# Patient Record
Sex: Male | Born: 1990 | Race: Black or African American | Hispanic: No | Marital: Single | Smoking: Never smoker
Health system: Southern US, Community
[De-identification: ages and names within clinical notes are randomized; demographics above are authoritative.]

---
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
## 2017-09-14 ENCOUNTER — Other Ambulatory Visit: Payer: Self-pay | Admitting: Specialist

## 2017-09-14 DIAGNOSIS — G44209 Tension-type headache, unspecified, not intractable: Secondary | ICD-10-CM

## 2017-09-14 DIAGNOSIS — M5412 Radiculopathy, cervical region: Secondary | ICD-10-CM

## 2017-09-19 ENCOUNTER — Other Ambulatory Visit: Payer: Self-pay

## 2017-09-24 ENCOUNTER — Ambulatory Visit
Admission: RE | Admit: 2017-09-24 | Discharge: 2017-09-24 | Disposition: A | Discharge status: Home / Self Care | Payer: Self-pay | Source: Ambulatory Visit | Attending: Specialist | Admitting: Specialist

## 2017-09-24 ENCOUNTER — Other Ambulatory Visit: Payer: Self-pay

## 2017-09-24 DIAGNOSIS — M5412 Radiculopathy, cervical region: Secondary | ICD-10-CM

## 2017-09-24 DIAGNOSIS — G44209 Tension-type headache, unspecified, not intractable: Secondary | ICD-10-CM

## 2017-09-26 ENCOUNTER — Other Ambulatory Visit: Payer: Self-pay | Admitting: Specialist

## 2017-09-26 ENCOUNTER — Ambulatory Visit
Admission: RE | Admit: 2017-09-26 | Discharge: 2017-09-26 | Disposition: A | Discharge status: Home / Self Care | Payer: BC Managed Care – PPO | Source: Ambulatory Visit | Attending: Specialist | Admitting: Specialist

## 2017-09-26 DIAGNOSIS — M25511 Pain in right shoulder: Secondary | ICD-10-CM

## 2018-06-02 ENCOUNTER — Encounter: Payer: Self-pay | Admitting: Family Medicine

## 2018-06-02 ENCOUNTER — Ambulatory Visit (INDEPENDENT_AMBULATORY_CARE_PROVIDER_SITE_OTHER): Payer: BC Managed Care – PPO | Admitting: Family Medicine

## 2018-06-02 VITALS — BP 120/70 | HR 59 | Temp 98.3°F | Ht 76.0 in | Wt 196.1 lb

## 2018-06-02 DIAGNOSIS — F320 Major depressive disorder, single episode, mild: Secondary | ICD-10-CM | POA: Insufficient documentation

## 2018-06-02 DIAGNOSIS — F4310 Post-traumatic stress disorder, unspecified: Secondary | ICD-10-CM | POA: Diagnosis not present

## 2018-06-02 DIAGNOSIS — Z0000 Encounter for general adult medical examination without abnormal findings: Secondary | ICD-10-CM

## 2018-06-02 NOTE — Patient Instructions (Signed)
Posttraumatic Stress Disorder Posttraumatic stress disorder (PTSD) is a mental health disorder that can occur after a traumatic event, such as a threat to life, serious injury, or sexual violence. Some people who experience these types of events may develop PTSD. Sometimes, PTSD can occur in people who hear about trauma that occurs to a close family member or friend. PTSD can happen to anyone at any age. What increases the risk? This condition is more likely to occur in:  Engineer, manufacturing.  People who have been the victims of, or witness to, a traumatic event, such as: ? Domestic violence. ? Childhood physical or sexual abuse. ? Rape. ? Natural disasters. ? Accidents involving serious injury.  What are the signs or symptoms? Symptoms of PTSD can be grouped into several categories: intrusive, avoidance, increased arousal, and negative moods and thoughts. Intrusive Symptoms This is when a person re-experiences the traumatic event through one or more of the following ways:  Distressing dreams.  Feelings of fear, horror, intense sadness, or anger in response to a reminder of the trauma.  Unwanted distressing memories while awake.  Physical reactions triggered by reminders of the trauma, such as increased heart rate, shortness of breath, sweating, and shaking.  Having flashbacks, or feelings like you are going through the event again.  Avoidance Symptoms This is when a person avoids thoughts, conversations, people, or activities that are reminders of the trauma. Symptoms may also include:  Decreased interest or participation in daily activities.  Loss of connection or avoidance of other people.  Increased Arousal Symptoms This is when a person is more sensitive or reacts more easily to their environment. Symptoms may include:  Being easily startled.  Careless or self-destructive behavior.  Irritability.  Feeling on edge.  Difficulty  concentrating.  Verbal or physical outbursts of anger toward other people or objects.  Difficulty sleeping.  Negative Moods or Thoughts These may include:  Belief that oneself or others are bad.  Regular feelings of fear, horror, anger, sadness, guilt, or shame.  Not being able to remember certain parts of the traumatic event.  Blaming themselves or others for the trauma.  Inability to experience positive emotions, such as happiness or love.  PTSD symptoms may start soon after a frightening event or months or years later. Symptoms last at least one month and tend to disrupt relationships, work, and daily activities. How is this diagnosed? PTSD is diagnosed through an assessment by a mental health professional. Daniel Barron will be asked questions about any traumatic events. You will also be asked about how these events have changed your thoughts, mood, behavior, and ability to function on a daily basis. You may also be asked if you use alcohol or drugs. How is this treated? Treatment for PTSD may include:  Medicines. Certain medicines can reduce some PTSD symptoms.  Counseling (cognitive behavioral therapy). Talk therapy with a mental health professional who is experienced in treating PTSD can help.  Eye movement desensitization and reprocessing therapy (EMDR). This type of therapy occurs with a specialized therapist.  Many people with PTSD benefit from a combination of these treatments. If you have other mental health problems, such as depression, alcohol abuse, or drug addiction, your treatment plan will include treatment for these other conditions. Follow these instructions at home: Lifestyle  Find a support group in your community. Often groups are available for [employer] veterans, trauma victims, and family members or caregivers.  Find ways to relax. This may include: ? Breathing exercises. ? Meditation. ?  Yoga. ? Listening to quiet music.  Exercise regularly. Try to do at least  30 minutes of physical activity most days of the week.  Try to get 7-9 hours of sleep each night. To help with sleep: ? Keep your bedroom cool and dark. ? Do not eat a heavy meal within one hour of bedtime. ? Do not drink alcohol or caffeinated drinks before bed. ? Avoid screen time, such as television, computers, tablets, or cell phones before bed.  Do not drink alcohol or take illegal drugs.  Look into volunteer opportunities. This can help you feel more connected to your community.  Take steps to help yourself feel safer at home, such as by installing a security system.  Contact a local organization to find out if you are eligible for a service dog.  Keep daily contact with at least one trusted friend or family member.  If your PTSD is affecting your marriage or family, seek help from a family therapist. General instructions  Take over-the-counter and prescription medicines only as told by your health care provider.  Keep all follow-up visits as told by your health care provider and counselor. This is important.  Make sure to let all of your health care providers know you have PTSD. This is especially important if you are having surgery or need to be admitted to the hospital. Contact a health care provider if:  Your symptoms do not get better or get worse. Get help right away if:  You have thoughts of wanting to harm yourself or others. This information is not intended to replace advice given to you by your health care provider. Make sure you discuss any questions you have with your health care provider. Document Released: 07/27/2001 Document Revised: 02/23/2016 Document Reviewed: 10/17/2015 Elsevier Interactive Patient Education  2018 Elsevier Inc.  
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
## 2018-06-13 ENCOUNTER — Encounter (HOSPITAL_COMMUNITY): Payer: Self-pay | Admitting: Licensed Clinical Social Worker

## 2018-06-13 ENCOUNTER — Ambulatory Visit (INDEPENDENT_AMBULATORY_CARE_PROVIDER_SITE_OTHER): Payer: BC Managed Care – PPO | Admitting: Licensed Clinical Social Worker

## 2018-06-13 DIAGNOSIS — F320 Major depressive disorder, single episode, mild: Secondary | ICD-10-CM | POA: Diagnosis not present

## 2018-06-13 DIAGNOSIS — F4310 Post-traumatic stress disorder, unspecified: Secondary | ICD-10-CM | POA: Diagnosis not present
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
## 2018-07-25 ENCOUNTER — Ambulatory Visit (HOSPITAL_COMMUNITY): Payer: BC Managed Care – PPO | Admitting: Licensed Clinical Social Worker

## 2018-07-25 DIAGNOSIS — F320 Major depressive disorder, single episode, mild: Secondary | ICD-10-CM

## 2018-07-26 ENCOUNTER — Encounter (HOSPITAL_COMMUNITY): Payer: Self-pay | Admitting: Licensed Clinical Social Worker
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
## 2018-08-09 ENCOUNTER — Ambulatory Visit (HOSPITAL_COMMUNITY): Payer: Self-pay | Admitting: Licensed Clinical Social Worker

## 2018-08-30 ENCOUNTER — Ambulatory Visit (INDEPENDENT_AMBULATORY_CARE_PROVIDER_SITE_OTHER): Payer: BC Managed Care – PPO | Admitting: Licensed Clinical Social Worker

## 2018-08-30 ENCOUNTER — Encounter (HOSPITAL_COMMUNITY): Payer: Self-pay | Admitting: Licensed Clinical Social Worker

## 2018-08-30 DIAGNOSIS — F320 Major depressive disorder, single episode, mild: Secondary | ICD-10-CM

## 2018-08-30 DIAGNOSIS — F4310 Post-traumatic stress disorder, unspecified: Secondary | ICD-10-CM | POA: Diagnosis not present
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
## 2018-10-24 ENCOUNTER — Ambulatory Visit (INDEPENDENT_AMBULATORY_CARE_PROVIDER_SITE_OTHER): Payer: BC Managed Care – PPO | Admitting: Licensed Clinical Social Worker

## 2018-10-24 DIAGNOSIS — F320 Major depressive disorder, single episode, mild: Secondary | ICD-10-CM | POA: Diagnosis not present

## 2018-10-31 ENCOUNTER — Encounter (HOSPITAL_COMMUNITY): Payer: Self-pay | Admitting: Licensed Clinical Social Worker
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
## 2018-11-22 ENCOUNTER — Ambulatory Visit (HOSPITAL_COMMUNITY): Payer: BC Managed Care – PPO | Admitting: Licensed Clinical Social Worker
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
## 2018-12-05 ENCOUNTER — Ambulatory Visit (INDEPENDENT_AMBULATORY_CARE_PROVIDER_SITE_OTHER): Payer: BC Managed Care – PPO | Admitting: Licensed Clinical Social Worker

## 2018-12-05 DIAGNOSIS — F4310 Post-traumatic stress disorder, unspecified: Secondary | ICD-10-CM | POA: Diagnosis not present

## 2018-12-05 DIAGNOSIS — F320 Major depressive disorder, single episode, mild: Secondary | ICD-10-CM

## 2018-12-06 ENCOUNTER — Encounter (HOSPITAL_COMMUNITY): Payer: Self-pay | Admitting: Licensed Clinical Social Worker
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
## 2018-12-26 ENCOUNTER — Ambulatory Visit (HOSPITAL_COMMUNITY): Payer: BC Managed Care – PPO | Admitting: Licensed Clinical Social Worker

## 2019-01-03 ENCOUNTER — Ambulatory Visit (INDEPENDENT_AMBULATORY_CARE_PROVIDER_SITE_OTHER): Payer: BC Managed Care – PPO | Admitting: Licensed Clinical Social Worker

## 2019-01-03 ENCOUNTER — Encounter (HOSPITAL_COMMUNITY): Payer: Self-pay | Admitting: Licensed Clinical Social Worker

## 2019-01-03 DIAGNOSIS — F4310 Post-traumatic stress disorder, unspecified: Secondary | ICD-10-CM

## 2019-01-03 DIAGNOSIS — F320 Major depressive disorder, single episode, mild: Secondary | ICD-10-CM | POA: Diagnosis not present
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
## 2019-01-17 ENCOUNTER — Ambulatory Visit (HOSPITAL_COMMUNITY): Payer: BC Managed Care – PPO | Admitting: Licensed Clinical Social Worker

## 2019-01-30 ENCOUNTER — Ambulatory Visit (HOSPITAL_COMMUNITY): Payer: BC Managed Care – PPO | Admitting: Licensed Clinical Social Worker

## 2019-01-31 ENCOUNTER — Ambulatory Visit (HOSPITAL_COMMUNITY): Payer: BC Managed Care – PPO | Admitting: Licensed Clinical Social Worker

## 2019-02-05 ENCOUNTER — Ambulatory Visit (HOSPITAL_COMMUNITY): Payer: BC Managed Care – PPO | Admitting: Licensed Clinical Social Worker

## 2019-03-06 ENCOUNTER — Other Ambulatory Visit: Payer: Self-pay

## 2019-03-06 ENCOUNTER — Ambulatory Visit (INDEPENDENT_AMBULATORY_CARE_PROVIDER_SITE_OTHER): Payer: BC Managed Care – PPO | Admitting: Licensed Clinical Social Worker

## 2019-03-06 ENCOUNTER — Encounter (HOSPITAL_COMMUNITY): Payer: Self-pay | Admitting: Licensed Clinical Social Worker

## 2019-03-06 DIAGNOSIS — F4310 Post-traumatic stress disorder, unspecified: Secondary | ICD-10-CM

## 2019-03-06 DIAGNOSIS — F320 Major depressive disorder, single episode, mild: Secondary | ICD-10-CM
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
## 2019-03-13 ENCOUNTER — Telehealth: Payer: Self-pay | Admitting: Family Medicine

## 2019-03-13 NOTE — Telephone Encounter (Signed)
I called and left message on patient voicemail to call office and schedule follow up appointment with Dr. Kremer.  °

## 2019-04-23 ENCOUNTER — Ambulatory Visit (INDEPENDENT_AMBULATORY_CARE_PROVIDER_SITE_OTHER): Payer: BC Managed Care – PPO | Admitting: Licensed Clinical Social Worker

## 2019-04-23 ENCOUNTER — Encounter (HOSPITAL_COMMUNITY): Payer: Self-pay | Admitting: Licensed Clinical Social Worker

## 2019-04-23 DIAGNOSIS — F4310 Post-traumatic stress disorder, unspecified: Secondary | ICD-10-CM

## 2019-04-23 DIAGNOSIS — F320 Major depressive disorder, single episode, mild: Secondary | ICD-10-CM | POA: Diagnosis not present

## 2019-04-23 NOTE — Progress Notes (Signed)
Virtual Visit via Video Note  I connected with Daniel Barron on 04/23/19 at 11:00 AM EDT by a video enabled telemedicine application and verified that I am speaking with the correct person using two identifiers.  Location: Patient: home Provider: Office   I discussed the limitations of evaluation and management by telemedicine and the availability of in person appointments. The patient expressed understanding and agreed to proceed.      I discussed the assessment and treatment plan with the patient. The patient was provided an opportunity to ask questions and all were answered. The patient agreed with the plan and demonstrated an understanding of the instructions.   The patient was advised to call back or seek an in-person evaluation if the symptoms worsen or if the condition fails to improve as anticipated.  I provided 55 minutes of non-face-to-face time during this encounter.   Archie Balboa, LCAS-A    THERAPIST PROGRESS NOTE  Session Time: 11-12pm  Participation Level: Active  Behavioral Response: Well GroomedAlertEuthymic  Type of Therapy: Individual Therapy  Treatment Goals addressed: Anxiety  Interventions: CBT and Psychosocial Skills: [phone] Mindfulness Activity for Stress reduction  Summary: Daniel Barron is a 28 y.o. male who presents with PTSD and depression. He is active and engaged in our virtual session. PT has written down planned discussion topics of feeling triggered w/ his PTSD by viewing too much social media of traumatic events, and having a panic attack 1 week ago. PT and I discuss ways to limit his exposure to traumatic videos while still paying attention to news articles. I explained the 54321 Mindfulness exercise for sensory coping skills, discussed grounding. PT was very agreeable to these. PT informed me he is seeing "major differences in his interactions w/ his grandparents" and he has also started the workbook we discussed, "[employer]".     Suicidal/Homicidal: Nowithout intent/plan  Therapist Response: Counselor used open questions, active listening, and Psychoeducational of Mindfulness based CBT coping skills for anxiety reduction.   Plan: Return again in 2 weeks.  Diagnosis: Depression, major, single episode, mild ([place])  Post traumatic stress disorder     Archie Balboa, LCAS-A 04/23/2019
